# Patient Record
Sex: Male | Born: 1975 | Hispanic: No | Marital: Married | Smoking: Former smoker
Health system: Southern US, Community
[De-identification: ages and names within clinical notes are randomized; demographics above are authoritative.]

## PROBLEM LIST (undated history)

## (undated) DIAGNOSIS — S42309A Unspecified fracture of shaft of humerus, unspecified arm, initial encounter for closed fracture: Secondary | ICD-10-CM

## (undated) DIAGNOSIS — M549 Dorsalgia, unspecified: Secondary | ICD-10-CM

## (undated) DIAGNOSIS — R7301 Impaired fasting glucose: Secondary | ICD-10-CM

## (undated) DIAGNOSIS — E8881 Metabolic syndrome: Secondary | ICD-10-CM

## (undated) DIAGNOSIS — E669 Obesity, unspecified: Secondary | ICD-10-CM

## (undated) DIAGNOSIS — E785 Hyperlipidemia, unspecified: Secondary | ICD-10-CM

## (undated) DIAGNOSIS — G4733 Obstructive sleep apnea (adult) (pediatric): Secondary | ICD-10-CM

## (undated) DIAGNOSIS — F419 Anxiety disorder, unspecified: Secondary | ICD-10-CM

## (undated) HISTORY — DX: Impaired fasting glucose: R73.01

## (undated) HISTORY — DX: Obstructive sleep apnea (adult) (pediatric): G47.33

## (undated) HISTORY — DX: Metabolic syndrome: E88.81

## (undated) HISTORY — DX: Anxiety disorder, unspecified: F41.9

## (undated) HISTORY — DX: Unspecified fracture of shaft of humerus, unspecified arm, initial encounter for closed fracture: S42.309A

## (undated) HISTORY — DX: Hyperlipidemia, unspecified: E78.5

## (undated) HISTORY — DX: Obesity, unspecified: E66.9

## (undated) HISTORY — DX: Dorsalgia, unspecified: M54.9

---

## 2008-05-24 ENCOUNTER — Ambulatory Visit (HOSPITAL_BASED_OUTPATIENT_CLINIC_OR_DEPARTMENT_OTHER): Admission: RE | Admit: 2008-05-24 | Discharge: 2008-05-24 | Payer: Self-pay | Admitting: Internal Medicine

## 2008-05-27 ENCOUNTER — Ambulatory Visit: Payer: Self-pay | Admitting: Internal Medicine

## 2010-07-30 NOTE — Procedures (Signed)
Bobby Murphy, Bobby Murphy             ACCOUNT NO.:  [id number]   MEDICAL RECORD NO.:  [id number]          PATIENT TYPE:  OUT   LOCATION:  SLEEP CENTER                 FACILITY:  [facility]   PHYSICIAN:  Clinton D. Maple Hudson, MD, FCCP, FACPDATE OF BIRTH:  01-15-77   DATE OF STUDY:  05/24/2008                            NOCTURNAL POLYSOMNOGRAM   REFERRING PHYSICIAN:   REFERRING PHYSICIAN:  Kari Baars, MD   INDICATION FOR STUDY:  Hypersomnia with sleep apnea.   EPWORTH SLEEPINESS SCORE:  13/24, BMI 33.  Weight 230 pounds, height 70  inches.  Neck 17 inches.   MEDICATIONS:  Home medication charted and reviewed.   SLEEP ARCHITECTURE:  Split study protocol.  During the diagnostic phase,  total sleep time was 125 minutes with sleep efficiency 70.1%.  Stage I  was 4%, stage II 96%, stages III and REM were absent.  Sleep latency  26.5 minutes, awake during sleep 25 minutes, arousal index 31.1.  No  bedtime medication was taken.   RESPIRATORY DATA:  Split study protocol.  Apnea-hypopnea index (AHI)  23.4 per hour.  A total of 49 events was scored including 20 obstructive  apneas, 1 mixed apnea, and 28 hypopneas.  Events were predominantly  while supine.  CPAP was then titrated to 7 CWP, AHI 0.4 per hour.  He  wore a medium Respironics ComfortGel mask with heated humidifier.   OXYGEN DATA:  Moderately loud snoring before CPAP with oxygen  desaturation to a nadir of 88%.  After CPAP control, snoring was  prevented and mean oxygen saturation held 95.6% on room air.   CARDIAC DATA:  Sinus rhythm.   MOVEMENT-PARASOMNIA:  No significant movement disturbance.  Bathroom x1.   IMPRESSIONS-RECOMMENDATIONS:  1. Moderate obstructive sleep apnea/hypopnea syndrome, AHI 23.4 per      hour with most events recorded while supine.  Moderately loud      snoring with oxygen desaturation to a nadir of 88%.  2. Successful CPAP titration to 7 CWP, AHI 0.4 per hour.  He chose a      medium Respironics  ComfortGel mask with heated humidifier.      Clinton D. Maple Hudson, MD, [facility], FACP  Diplomate, Biomedical engineer of Sleep Medicine  Electronically Signed     CDY/MEDQ  D:  05/28/2008 11:00:21  T:  05/29/2008 03:13:18  Job:  [id number]

## 2014-04-19 ENCOUNTER — Other Ambulatory Visit (INDEPENDENT_AMBULATORY_CARE_PROVIDER_SITE_OTHER): Payer: Self-pay | Admitting: Surgery

## 2014-04-19 NOTE — H&P (Signed)
Bobby Murphy 04/19/2014 10:21 AM Location: [facility] Patient #: [id number] DOB: [birth year] Married / Language: [place] / Race: White Male History of Present Illness Ardeth Sportsman MD; 04/19/2014 10:46 AM) The patient is a 39 year old male who presents with an umbilical hernia. Pleasant patient sent by his primary care physician of concerns of symptomatic umbilical hernia. Pleasant obese very active male. Has felt some mild discomfort and lumpiness at his belly button for the past year. However became more tender and sensitive with increased activity this past winter. Especially with chopping wood and heavy lifting. He doesn't worsening pain and sensitive lump was concerned. I saw primary care physician. Concern for symptomatic umbilical hernia. Surgical consultation requested. Patient is rather active. Can hike a couple miles without difficulty. Has bowel movement every day. No prior abdominal surgery. No history of skin infections. No history of cardiac or pulmonary issues. He does not smoke. He is not on blood thinners. Other Problems Ethlyn Gallery, MA; 04/19/2014 10:22 AM) Anxiety Disorder Back Pain Transfusion history Umbilical Hernia Repair  Past Surgical History Elease Hashimoto Spillers, MA; 04/19/2014 10:22 AM) No pertinent past surgical history  Diagnostic Studies History Elease Hashimoto [place], MA; 04/19/2014 10:22 AM) Colonoscopy never  Allergies Elease Hashimoto Spillers, MA; 04/19/2014 10:23 AM) No Known Drug Allergies 04/19/2014  Medication History (Alisha Spillers, MA; 04/19/2014 10:23 AM) Cymbalta (  Capsule DR Part, Oral) Active. Fish Oil (  Capsule DR, Oral) Active. Medications Reconciled  Social History Elease Hashimoto [place], [place]; 04/19/2014 10:22 AM) Alcohol use Occasional alcohol use. Caffeine use Carbonated beverages, Coffee, Tea. Illicit drug use Remotely quit drug use. Tobacco use Former smoker.  Family History Elease Hashimoto [place], [place]; 04/19/2014  10:22 AM) Alcohol Abuse Brother.     Review of Systems (Alisha Spillers MA; 04/19/2014 10:22 AM) General Not Present- Appetite Loss, Chills, Fatigue, Fever, Night Sweats, Weight Gain and Weight Loss. Skin Not Present- Change in Wart/Mole, Dryness, Hives, Jaundice, New Lesions, Non-Healing Wounds, Rash and Ulcer. HEENT Present- Seasonal Allergies. Not Present- Earache, Hearing Loss, Hoarseness, Nose Bleed, Oral Ulcers, Ringing in the Ears, Sinus Pain, Sore Throat, Visual Disturbances, Wears glasses/contact lenses and Yellow Eyes. Respiratory Not Present- Bloody sputum, Chronic Cough, Difficulty Breathing, Snoring and Wheezing. Breast Not Present- Breast Mass, Breast Pain, Nipple Discharge and Skin Changes. Cardiovascular Not Present- Chest Pain, Difficulty Breathing Lying Down, Leg Cramps, Palpitations, Rapid Heart Rate, Shortness of Breath and Swelling of Extremities. Gastrointestinal Not Present- Abdominal Pain, Bloating, Bloody Stool, Change in Bowel Habits, Chronic diarrhea, Constipation, Difficulty Swallowing, Excessive gas, Gets full quickly at meals, Hemorrhoids, Indigestion, Nausea, Rectal Pain and Vomiting. Male Genitourinary Not Present- Blood in Urine, Change in Urinary Stream, Frequency, Impotence, Nocturia, Painful Urination, Urgency and Urine Leakage. Musculoskeletal Not Present- Back Pain, Joint Pain, Joint Stiffness, Muscle Pain, Muscle Weakness and Swelling of Extremities. Neurological Not Present- Decreased Memory, Fainting, Headaches, Numbness, Seizures, Tingling, Tremor, Trouble walking and Weakness. Psychiatric Present- Anxiety. Not Present- Bipolar, Change in Sleep Pattern, Depression, Fearful and Frequent crying. Endocrine Not Present- Cold Intolerance, Excessive Hunger, Hair Changes, Heat Intolerance, Hot flashes and New Diabetes. Hematology Not Present- Easy Bruising, Excessive bleeding, Gland problems, HIV and Persistent Infections.  Vitals (Alisha Spillers MA; 04/19/2014  10:22 AM) 04/19/2014 10:22 AM Weight: 262 lb Height: 70in Body Surface Area: 2.42 m Body Mass Index: 37.59 kg/m Pulse: 73 (Regular)  BP: 142/82 (Sitting, Left Arm, Standard)     Assessment & Plan Ardeth Sportsman MD; 04/19/2014 10:44 AM)  Dorethea Clan UMBILICAL HERNIA (552.1  K42.0) Impression: Very sensitive but  no evidence of strangulation. I think he would benefit repair. Given his young age and obesity, would benefit from mesh to minimize recurrence. I would do a diagnostic laparoscopy with underlay repair. He and his wife are interested in proceeding. Hopefully outpatient procedure:  The anatomy & physiology of the abdominal wall was discussed. The pathophysiology of hernias was discussed. Natural history risks without surgery including progeressive enlargement, pain, incarceration & strangulation was discussed. Contributors to complications such as smoking, obesity, diabetes, prior surgery, etc were discussed.  I feel the risks of no intervention will lead to serious problems that outweigh the operative risks; therefore, I recommended surgery to reduce and repair the hernia. I explained laparoscopic techniques with possible need for an open approach. I noted the probable use of mesh to patch and/or buttress the hernia repair  Risks such as bleeding, infection, abscess, need for further treatment, heart attack, death, and other risks were discussed. I noted a good likelihood this will help address the problem. Goals of post-operative recovery were discussed as well. Possibility that this will not correct all symptoms was explained. I stressed the importance of low-impact activity, aggressive pain control, avoiding constipation, & not pushing through pain to minimize risk of post-operative chronic pain or injury. Possibility of reherniation especially with smoking, obesity, diabetes, immunosuppression, and other health conditions was discussed. We will work to minimize  complications.  An educational handout further explaining the pathology & treatment options was given as well. Questions were answered. The patient expresses understanding & wishes to proceed with surgery.  Current Plans Schedule for Surgery Pt Education - [facility] Hernia Post-Op HCI (Peggy Monk): discussed with patient and provided information. Pt Education - [facility] Pain Control (Erleen Egner) Pt Education - [facility] Good Bowel Health (Rhyleigh Grassel) The anatomy & physiology of the abdominal wall was discussed. The pathophysiology of hernias was discussed. Natural history risks without surgery including progeressive enlargement, pain, incarceration, & strangulation was discussed. Contributors to complications such as smoking, obesity, diabetes, prior surgery, etc were discussed.  I feel the risks of no intervention will lead to serious problems that outweigh the operative risks; therefore, I recommended surgery to reduce and repair the hernia. I explained laparoscopic techniques with possible need for an open approach. I noted the probable use of mesh to patch and/or buttress the hernia repair  Risks such as bleeding, infection, abscess, need for further treatment, heart attack, death, and other risks were discussed. I noted a good likelihood this will help address the problem. Goals of post-operative recovery were discussed as well. Possibility that this will not correct all symptoms was explained. I stressed the importance of low-impact activity, aggressive pain control, avoiding constipation, & not pushing through pain to minimize risk of post-operative chronic pain or injury. Possibility of reherniation especially with smoking, obesity, diabetes, immunosuppression, and other health conditions was discussed. We will work to minimize complications.  An educational handout further explaining the pathology & treatment options was given as well. Questions were answered. The patient expresses understanding & wishes to proceed with  surgery.  Ardeth SportsmanSteven C. Meiko Stranahan, M.D., F.A.C.S. Gastrointestinal and Minimally Invasive Surgery [facility] [address] [place], [place] [address] [phone] Main / Paging

## 2015-07-31 DIAGNOSIS — L732 Hidradenitis suppurativa: Secondary | ICD-10-CM | POA: Diagnosis not present

## 2015-08-06 DIAGNOSIS — Z0000 Encounter for general adult medical examination without abnormal findings: Secondary | ICD-10-CM | POA: Diagnosis not present

## 2015-08-06 DIAGNOSIS — R7301 Impaired fasting glucose: Secondary | ICD-10-CM | POA: Diagnosis not present

## 2015-08-06 DIAGNOSIS — Z125 Encounter for screening for malignant neoplasm of prostate: Secondary | ICD-10-CM | POA: Diagnosis not present

## 2015-08-15 DIAGNOSIS — E668 Other obesity: Secondary | ICD-10-CM | POA: Diagnosis not present

## 2015-08-15 DIAGNOSIS — R7301 Impaired fasting glucose: Secondary | ICD-10-CM | POA: Diagnosis not present

## 2015-08-15 DIAGNOSIS — E8881 Metabolic syndrome: Secondary | ICD-10-CM | POA: Diagnosis not present

## 2015-08-15 DIAGNOSIS — Z0000 Encounter for general adult medical examination without abnormal findings: Secondary | ICD-10-CM | POA: Diagnosis not present

## 2015-08-15 DIAGNOSIS — E784 Other hyperlipidemia: Secondary | ICD-10-CM | POA: Diagnosis not present

## 2015-08-15 DIAGNOSIS — Z1389 Encounter for screening for other disorder: Secondary | ICD-10-CM | POA: Diagnosis not present

## 2016-04-25 DIAGNOSIS — G4733 Obstructive sleep apnea (adult) (pediatric): Secondary | ICD-10-CM | POA: Diagnosis not present

## 2016-09-04 DIAGNOSIS — R7301 Impaired fasting glucose: Secondary | ICD-10-CM | POA: Diagnosis not present

## 2016-09-04 DIAGNOSIS — Z0000 Encounter for general adult medical examination without abnormal findings: Secondary | ICD-10-CM | POA: Diagnosis not present

## 2016-09-04 DIAGNOSIS — Z125 Encounter for screening for malignant neoplasm of prostate: Secondary | ICD-10-CM | POA: Diagnosis not present

## 2016-09-04 DIAGNOSIS — E784 Other hyperlipidemia: Secondary | ICD-10-CM | POA: Diagnosis not present

## 2016-09-09 DIAGNOSIS — D225 Melanocytic nevi of trunk: Secondary | ICD-10-CM | POA: Diagnosis not present

## 2016-09-09 DIAGNOSIS — L570 Actinic keratosis: Secondary | ICD-10-CM | POA: Diagnosis not present

## 2016-09-11 DIAGNOSIS — Z0000 Encounter for general adult medical examination without abnormal findings: Secondary | ICD-10-CM | POA: Diagnosis not present

## 2016-09-11 DIAGNOSIS — Z1389 Encounter for screening for other disorder: Secondary | ICD-10-CM | POA: Diagnosis not present

## 2016-09-11 DIAGNOSIS — E784 Other hyperlipidemia: Secondary | ICD-10-CM | POA: Diagnosis not present

## 2016-09-11 DIAGNOSIS — E669 Obesity, unspecified: Secondary | ICD-10-CM | POA: Diagnosis not present

## 2016-09-11 DIAGNOSIS — R7301 Impaired fasting glucose: Secondary | ICD-10-CM | POA: Diagnosis not present

## 2016-09-11 DIAGNOSIS — E8881 Metabolic syndrome: Secondary | ICD-10-CM | POA: Diagnosis not present

## 2016-10-31 DIAGNOSIS — G4733 Obstructive sleep apnea (adult) (pediatric): Secondary | ICD-10-CM | POA: Diagnosis not present

## 2017-02-03 DIAGNOSIS — G4733 Obstructive sleep apnea (adult) (pediatric): Secondary | ICD-10-CM | POA: Diagnosis not present

## 2017-03-13 DIAGNOSIS — J101 Influenza due to other identified influenza virus with other respiratory manifestations: Secondary | ICD-10-CM | POA: Diagnosis not present

## 2017-05-12 DIAGNOSIS — G4733 Obstructive sleep apnea (adult) (pediatric): Secondary | ICD-10-CM | POA: Diagnosis not present

## 2017-08-11 DIAGNOSIS — G4733 Obstructive sleep apnea (adult) (pediatric): Secondary | ICD-10-CM | POA: Diagnosis not present

## 2017-09-09 DIAGNOSIS — R7301 Impaired fasting glucose: Secondary | ICD-10-CM | POA: Diagnosis not present

## 2017-09-09 DIAGNOSIS — E7849 Other hyperlipidemia: Secondary | ICD-10-CM | POA: Diagnosis not present

## 2017-09-09 DIAGNOSIS — Z125 Encounter for screening for malignant neoplasm of prostate: Secondary | ICD-10-CM | POA: Diagnosis not present

## 2017-09-16 DIAGNOSIS — E668 Other obesity: Secondary | ICD-10-CM | POA: Diagnosis not present

## 2017-09-16 DIAGNOSIS — E7849 Other hyperlipidemia: Secondary | ICD-10-CM | POA: Diagnosis not present

## 2017-09-16 DIAGNOSIS — Z0000 Encounter for general adult medical examination without abnormal findings: Secondary | ICD-10-CM | POA: Diagnosis not present

## 2017-09-16 DIAGNOSIS — R7301 Impaired fasting glucose: Secondary | ICD-10-CM | POA: Diagnosis not present

## 2017-09-16 DIAGNOSIS — E8881 Metabolic syndrome: Secondary | ICD-10-CM | POA: Diagnosis not present

## 2017-09-16 DIAGNOSIS — Z1389 Encounter for screening for other disorder: Secondary | ICD-10-CM | POA: Diagnosis not present

## 2017-11-19 DIAGNOSIS — G4733 Obstructive sleep apnea (adult) (pediatric): Secondary | ICD-10-CM | POA: Diagnosis not present

## 2017-11-24 DIAGNOSIS — L814 Other melanin hyperpigmentation: Secondary | ICD-10-CM | POA: Diagnosis not present

## 2017-11-24 DIAGNOSIS — L570 Actinic keratosis: Secondary | ICD-10-CM | POA: Diagnosis not present

## 2017-11-24 DIAGNOSIS — L821 Other seborrheic keratosis: Secondary | ICD-10-CM | POA: Diagnosis not present

## 2018-09-13 DIAGNOSIS — E7849 Other hyperlipidemia: Secondary | ICD-10-CM | POA: Diagnosis not present

## 2018-09-13 DIAGNOSIS — Z0000 Encounter for general adult medical examination without abnormal findings: Secondary | ICD-10-CM | POA: Diagnosis not present

## 2018-09-13 DIAGNOSIS — Z125 Encounter for screening for malignant neoplasm of prostate: Secondary | ICD-10-CM | POA: Diagnosis not present

## 2018-09-13 DIAGNOSIS — R7301 Impaired fasting glucose: Secondary | ICD-10-CM | POA: Diagnosis not present

## 2018-09-21 DIAGNOSIS — E669 Obesity, unspecified: Secondary | ICD-10-CM | POA: Diagnosis not present

## 2018-09-21 DIAGNOSIS — R7301 Impaired fasting glucose: Secondary | ICD-10-CM | POA: Diagnosis not present

## 2018-09-21 DIAGNOSIS — E8881 Metabolic syndrome: Secondary | ICD-10-CM | POA: Diagnosis not present

## 2018-09-21 DIAGNOSIS — Z1331 Encounter for screening for depression: Secondary | ICD-10-CM | POA: Diagnosis not present

## 2018-09-21 DIAGNOSIS — E785 Hyperlipidemia, unspecified: Secondary | ICD-10-CM | POA: Diagnosis not present

## 2018-09-21 DIAGNOSIS — Z0000 Encounter for general adult medical examination without abnormal findings: Secondary | ICD-10-CM | POA: Diagnosis not present

## 2018-10-13 ENCOUNTER — Ambulatory Visit: Payer: Self-pay | Admitting: Surgery

## 2018-10-13 DIAGNOSIS — K432 Incisional hernia without obstruction or gangrene: Secondary | ICD-10-CM | POA: Diagnosis not present

## 2018-10-13 DIAGNOSIS — M6208 Separation of muscle (nontraumatic), other site: Secondary | ICD-10-CM | POA: Diagnosis not present

## 2018-10-13 NOTE — H&P (Signed)
Maryagnes Amos Documented: 10/13/2018 9:50 AM Location: [facility] Patient #: [id number] DOB: 13-Jul-1975 Married / Language: Cleophus Molt / Race: White Male  History of Present Illness Adin Hector MD; 10/13/2018 10:38 AM) The patient is a 43 year old male who presents for a hernia surgery post-op. Note for "Hernia surgery post-op": ` ` ` The patient returns s/p laparoscopic repair of periumbilical ventral hernias with mesh in 2016.      Pleasant obese but very active man. I did laparoscopic mesh repair of periumbilical ventral hernias 2 in 2016. He recovered well from that. He's been doing more work and his parents house with intense chores. Felt some pulling and discomfort. Felt some bulging as well. He was concerned that maybe a hernia came back. Discuss with his primary care physician. Surgical consultation requested. Patient denies any fevers chills or sweats. No nausea or vomiting. He does remain obese, he is very physically active and is intentional some weight. He remains abstinent from smoking. No diabetes issues. He moves his bowels every day.   Problem List/Past Medical Adin Hector, MD; 10/13/2018 10:16 AM) Nira Conn UMBILICAL HERNIA (R67.8) S/P HERNIA REPAIR (L38.101)  Past Surgical History Nance Pew, [place]; 10/13/2018 9:52 AM) Oral Surgery  Diagnostic Studies History Nance Pew, CMA; 10/13/2018 9:52 AM) Colonoscopy never  Allergies ([place], CMA; 10/13/2018 9:53 AM) No Known Drug Allergies [04/19/2014]: Allergies Reconciled  Medication History Nance Pew, CMA; 10/13/2018 9:53 AM) Lexapro (10MG  Tablet, Oral) Active. Fish Oil (1000MG  Capsule DR, Oral) Active. Medications Reconciled  Social History Nance Pew, CMA; 10/13/2018 9:52 AM) Alcohol use Occasional alcohol use. Caffeine use Carbonated beverages, Coffee, Tea. Illicit drug use Remotely quit drug use. Tobacco use Former smoker.  Family History  Nance Pew, [place]; 10/13/2018 9:52 AM) Alcohol Abuse Brother. Anesthetic complications Family Members In General. Arthritis Family Members In General. Bleeding disorder Family Members In General. Breast Cancer Family Members In General. Cancer Family Members In General. Cerebrovascular Accident Family Members In General. Cervical Cancer Family Members In [place] Members In General. Colon Polyps Family Members In General. Depression Family Members In General. Diabetes Mellitus Mother. Heart Disease Family Members In General. Hypertension Family Members In General. Ischemic Bowel Disease Family Members In General. Kidney Disease Family Members In General. Malignant Neoplasm Of Pancreas Family Members In General. Melanoma Family Members In General. Migraine Headache Family Members In General. Ovarian Cancer Family Members In General. Prostate Cancer Family Members In General. Rectal Cancer Family Members In General. Respiratory Condition Family Members In General. Seizure disorder Family Members In General. Thyroid problems Family Members In General.  Other Problems Adin Hector, MD; 10/13/2018 10:16 AM) Anxiety Disorder     Review of Systems ([place]; 10/13/2018 9:52 AM) General Not Present- Appetite Loss, Chills, Fatigue, Fever, Night Sweats, Weight Gain and Weight Loss. Skin Not Present- Change in Wart/Mole, Dryness, Hives, Jaundice, New Lesions, Non-Healing Wounds, Rash and Ulcer. HEENT Not Present- Earache, Hearing Loss, Hoarseness, Nose Bleed, Oral Ulcers, Ringing in the Ears, Seasonal Allergies, Sinus Pain, Sore Throat, Visual Disturbances, Wears glasses/contact lenses and Yellow Eyes. Respiratory Not Present- Bloody sputum, Chronic Cough, Difficulty Breathing, Snoring and Wheezing. Breast Not Present- Breast Mass, Breast Pain, Nipple Discharge and Skin Changes. Cardiovascular Not Present- Chest Pain, Difficulty  Breathing Lying Down, Leg Cramps, Palpitations, Rapid Heart Rate, Shortness of Breath and Swelling of Extremities. Gastrointestinal Not Present- Abdominal Pain, Bloating, Bloody Stool, Change in Bowel Habits, Chronic diarrhea, Constipation, Difficulty Swallowing, Excessive gas, Gets full quickly at  meals, Hemorrhoids, Indigestion, Nausea, Rectal Pain and Vomiting. Male Genitourinary Not Present- Blood in Urine, Change in Urinary Stream, Frequency, Impotence, Nocturia, Painful Urination, Urgency and Urine Leakage. Musculoskeletal Not Present- Back Pain, Joint Pain, Joint Stiffness, Muscle Pain, Muscle Weakness and Swelling of Extremities. Neurological Not Present- Decreased Memory, Fainting, Headaches, Numbness, Seizures, Tingling, Tremor, Trouble walking and Weakness. Psychiatric Present- Anxiety. Not Present- Bipolar, Change in Sleep Pattern, Depression, Fearful and Frequent crying. Endocrine Not Present- Cold Intolerance, Excessive Hunger, Hair Changes, Heat Intolerance, Hot flashes and New Diabetes. Hematology Not Present- Blood Thinners, Easy Bruising, Excessive bleeding, Gland problems, HIV and Persistent Infections.  Vitals (Sabrina Canty CMA; 10/13/2018 9:54 AM) 10/13/2018 9:53 AM Weight: 244.13 lb Height: 70in Body Surface Area: 2.27 m Body Mass Index: 35.03 kg/m  Temp.: 97.89F(Oral)  Pulse: 95 (Regular)  P.OX: 97% (Room air) BP: 158/90 (Sitting, Left Arm, Standard)        Physical Exam Ardeth Sportsman(Bilbo Carcamo C. Ceola Para MD; 10/13/2018 10:38 AM)  General Mental Status-Alert. General Appearance-Not in acute distress. Voice-Normal.  Integumentary Global Assessment Normal Exam - Distribution of scalp and body hair is normal. General Characteristics Overall Skin Surface - no rashes and no suspicious lesions.  Head and Neck Head-normocephalic, atraumatic with no lesions or palpable masses. Face Global Assessment - atraumatic, no absence of expression. Neck Global  Assessment - no abnormal movements, no decreased range of motion. Trachea-midline. Thyroid Gland Characteristics - non-tender.  Eye Eyeball - Left-Extraocular movements intact, No Nystagmus - Left. Eyeball - Right-Extraocular movements intact, No Nystagmus - Right. Upper Eyelid - Left-No Cyanotic - Left. Upper Eyelid - Right-No Cyanotic - Right.  Chest and Lung Exam Inspection Accessory muscles - No use of accessory muscles in breathing.  Abdomen Note: Obese but soft. Some subcutaneous bulging in the epigastric midline region within diastases recti. I feel an impulse with him standing and coughing suspicious for. No periumbilical masses or hernias there is. No guarding. No cellulitis or abscess.  Male Genitourinary Note: No inguinal hernia  Peripheral Vascular Upper Extremity Inspection - Not Gangrenous, No Petechiae. Inspection - Not Gangrenous, No Petechiae.  Neurologic Neurologic evaluation reveals -normal attention span and ability to concentrate, able to name objects and repeat phrases. Appropriate fund of knowledge and normal coordination.  Neuropsychiatric Mental status exam performed with findings of-able to articulate well with normal speech/language, rate, volume and coherence and no evidence of hallucinations, delusions, obsessions or homicidal/suicidal ideation. Orientation-oriented X3.  Musculoskeletal Global Assessment Gait and Station - normal gait and station.  Lymphatic General Lymphatics Description - No Generalized lymphadenopathy.    Assessment & Plan Ardeth Sportsman(Calyx Hawker C. 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 I suspect it's at the upper corner of his prior laparoscopic mesh since his prior hernias were periumbilical and this is higher up. Most likely related to his intense physical activity,  obesity, and diastases recti.  I think he would benefit from surgery to repair. Do at least diagnostic laparoscopy. I don't think a CAT scan would add much to this since I think this is going to be more subtle. He is interested in getting it repaired. Unfortunately his mother in law suddenly passed away, so they are trying to sort that out. This is not an emergency so we have time to sort it out and get down hopefully before the end of the year.   PREOP - [facility] - ENCOUNTER FOR PREOPERATIVE EXAMINATION  FOR GENERAL SURGICAL PROCEDURE (Z01.818)  Current Plans You are being scheduled for surgery- Our schedulers will call you.  You should hear from our office's scheduling department within 5 working days about the location, date, and time of surgery. We try to make accommodations for patient's preferences in scheduling surgery, but sometimes the OR schedule or the surgeon's schedule prevents us from making those accommodations.  If you have not heard from our office [phone]) in 5 working days, call the office and ask for your surgeon's nurse.  If you have other questions about your diagnosis, plan, or surgery, call the office and ask for your surgeon's nurse.  Written instructions provided The anatomy & physiology of the abdominal wall was discussed. The pathophysiology of hernias was discussed. Natural history risks without surgery including progeressive enlargement, pain, incarceration, & strangulation was discussed. Contributors to complications such as smoking, obesity, diabetes, prior surgery, etc were discussed.  I feel the risks of no intervention will lead to serious problems that outweigh the operative risks; therefore, I recommended surgery to reduce and repair the hernia. I explained laparoscopic techniques with possible need for an open approach. I noted the probable use of mesh to patch and/or buttress the hernia repair  Risks such as bleeding, infection, abscess, need for  further treatment, heart attack, death, and other risks were discussed. I noted a good likelihood this will help address the problem. Goals of post-operative recovery were discussed as well. Possibility that this will not correct all symptoms was explained. I stressed the importance of low-impact activity, aggressive pain control, avoiding constipation, & not pushing through pain to minimize risk of post-operative chronic pain or injury. Possibility of reherniation especially with smoking, obesity, diabetes, immunosuppression, and other health conditions was discussed. We will work to minimize complications.  An educational handout further explaining the pathology & treatment options was given as well. Questions were answered. The patient expresses understanding & wishes to proceed with surgery.  Pt Education - [facility] Hernia Post-Op HCI (Aristide Waggle): discussed with patient and provided information. Pt Education - [facility] Pain Control (Christyne Mccain) Pt Education - Pamphlet Given - Laparoscopic Hernia Repair: discussed with patient and provided information. Pt Education - [facility] Mesh education: discussed with patient and provided information.  DIASTASIS RECTI (M62.08)  Current Plans Pt Education - [facility] Diastasis Recti: discussed with patient and provided information.  Ardeth SportsmanSteven C. Geremiah Fussell, MD, FACS, MASCRS Gastrointestinal and Minimally Invasive Surgery    [address] [place], [place] [id number] [phone] Main / Paging [phone] Fax

## 2019-01-16 DIAGNOSIS — R55 Syncope and collapse: Secondary | ICD-10-CM

## 2019-01-16 HISTORY — DX: Syncope and collapse: R55

## 2019-02-01 DIAGNOSIS — R030 Elevated blood-pressure reading, without diagnosis of hypertension: Secondary | ICD-10-CM | POA: Diagnosis not present

## 2019-02-01 DIAGNOSIS — R55 Syncope and collapse: Secondary | ICD-10-CM | POA: Diagnosis not present

## 2019-02-01 DIAGNOSIS — R7301 Impaired fasting glucose: Secondary | ICD-10-CM | POA: Diagnosis not present

## 2019-02-01 DIAGNOSIS — G4733 Obstructive sleep apnea (adult) (pediatric): Secondary | ICD-10-CM | POA: Diagnosis not present

## 2019-03-29 DIAGNOSIS — D225 Melanocytic nevi of trunk: Secondary | ICD-10-CM | POA: Diagnosis not present

## 2019-03-29 DIAGNOSIS — L2089 Other atopic dermatitis: Secondary | ICD-10-CM | POA: Diagnosis not present

## 2019-03-29 DIAGNOSIS — L578 Other skin changes due to chronic exposure to nonionizing radiation: Secondary | ICD-10-CM | POA: Diagnosis not present

## 2019-03-29 DIAGNOSIS — L570 Actinic keratosis: Secondary | ICD-10-CM | POA: Diagnosis not present

## 2019-06-14 DIAGNOSIS — G4733 Obstructive sleep apnea (adult) (pediatric): Secondary | ICD-10-CM | POA: Diagnosis not present

## 2019-09-12 DIAGNOSIS — G4733 Obstructive sleep apnea (adult) (pediatric): Secondary | ICD-10-CM | POA: Diagnosis not present

## 2019-10-11 DIAGNOSIS — E7849 Other hyperlipidemia: Secondary | ICD-10-CM | POA: Diagnosis not present

## 2019-10-11 DIAGNOSIS — R7301 Impaired fasting glucose: Secondary | ICD-10-CM | POA: Diagnosis not present

## 2019-10-11 DIAGNOSIS — Z0000 Encounter for general adult medical examination without abnormal findings: Secondary | ICD-10-CM | POA: Diagnosis not present

## 2019-10-11 DIAGNOSIS — Z125 Encounter for screening for malignant neoplasm of prostate: Secondary | ICD-10-CM | POA: Diagnosis not present

## 2019-12-12 DIAGNOSIS — G4733 Obstructive sleep apnea (adult) (pediatric): Secondary | ICD-10-CM | POA: Diagnosis not present

## 2020-02-16 DIAGNOSIS — Z20822 Contact with and (suspected) exposure to covid-19: Secondary | ICD-10-CM | POA: Diagnosis not present

## 2020-02-16 DIAGNOSIS — R82998 Other abnormal findings in urine: Secondary | ICD-10-CM | POA: Diagnosis not present

## 2020-03-12 DIAGNOSIS — G4733 Obstructive sleep apnea (adult) (pediatric): Secondary | ICD-10-CM | POA: Diagnosis not present

## 2020-03-15 DIAGNOSIS — L814 Other melanin hyperpigmentation: Secondary | ICD-10-CM | POA: Diagnosis not present

## 2020-03-15 DIAGNOSIS — D225 Melanocytic nevi of trunk: Secondary | ICD-10-CM | POA: Diagnosis not present

## 2020-03-15 DIAGNOSIS — L570 Actinic keratosis: Secondary | ICD-10-CM | POA: Diagnosis not present

## 2020-03-15 DIAGNOSIS — L578 Other skin changes due to chronic exposure to nonionizing radiation: Secondary | ICD-10-CM | POA: Diagnosis not present

## 2020-07-05 ENCOUNTER — Other Ambulatory Visit: Payer: Self-pay | Admitting: Neurology

## 2020-07-05 ENCOUNTER — Encounter: Payer: Self-pay | Admitting: Neurology

## 2020-07-10 ENCOUNTER — Institutional Professional Consult (permissible substitution): Payer: BC Managed Care – PPO | Admitting: Neurology

## 2020-07-23 DIAGNOSIS — G4733 Obstructive sleep apnea (adult) (pediatric): Secondary | ICD-10-CM | POA: Diagnosis not present

## 2020-08-21 ENCOUNTER — Encounter: Payer: Self-pay | Admitting: *Deleted

## 2020-08-21 ENCOUNTER — Encounter: Payer: Self-pay | Admitting: Neurology

## 2020-08-22 ENCOUNTER — Ambulatory Visit (INDEPENDENT_AMBULATORY_CARE_PROVIDER_SITE_OTHER): Payer: BC Managed Care – PPO | Admitting: Neurology

## 2020-08-22 ENCOUNTER — Encounter: Payer: Self-pay | Admitting: Neurology

## 2020-08-22 VITALS — BP 122/82 | HR 62 | Ht 70.0 in | Wt 252.0 lb

## 2020-08-22 DIAGNOSIS — Z9989 Dependence on other enabling machines and devices: Secondary | ICD-10-CM | POA: Diagnosis not present

## 2020-08-22 DIAGNOSIS — G4733 Obstructive sleep apnea (adult) (pediatric): Secondary | ICD-10-CM | POA: Diagnosis not present

## 2020-08-22 DIAGNOSIS — G478 Other sleep disorders: Secondary | ICD-10-CM

## 2020-08-22 NOTE — Patient Instructions (Signed)
[email or web address]">  Obesity-Hypoventilation Syndrome Obesity-hypoventilation syndrome (OHS) is a condition in which a person cannot efficiently move air in and out of the lungs (ventilate). This condition causes hypoventilation, which means the blood will have a buildup of carbon dioxideand a drop in oxygen levels. Key factors for OHS include having too much body fat, or obesity, and having high levels of awake daytime carbon dioxide (hypercapnia). OHS can increase the risk for:  Cor pulmonale, or right-sided heart failure.  Congestive heart failure.  Pulmonary hypertension, or high blood pressure in the arteries in the lungs.  Too many red blood cells in the body.  Disability or death. What are the causes? This condition may be caused by:  Being obese with a BMI (body mass index) greater than or equal to 30 kg/m2.  Having too much fat around the abdomen, chest, and neck.  The brain being unable to properly manage the high carbon dioxide and low oxygen levels.  Hormones made by fat cells. These hormones may interfere with breathing.  Sleep apnea. This is when breathing stops, pauses, or is shallow during sleep. What are the signs or symptoms? Symptoms of this condition include:  Feeling sleepy during the day.  Headaches. These may be worse in the morning.  Shortness of breath.  Snoring, choking, gasping, or trouble breathing during sleep.  Poor concentration or poor memory.  Mood changes or feeling irritable.  Depression. How is this diagnosed? This condition may be diagnosed by:  BMI measurement.  Blood tests to measure blood levels of serum bicarbonate, carbon dioxide, and oxygen.  Pulse oximetry to measure the amount of oxygen in your blood. This uses a small device that is placed on your finger, earlobe, or toe.  Polysomnogram, or sleep study, to check your breathing patterns and  levels of oxygen and carbon dioxide while you sleep. You may also have other tests, including:  A chest X-ray to rule out other breathing problems.  Lung tests, or pulmonary function tests, to rule out other breathing problems.  ECG (electrocardiogram) or echocardiogram to check for signs of heart failure. How is this treated? This condition may be treated with:  A device such as a continuous positive airway pressure (CPAP) machine or a bi-level positive airway pressure (BPAP) machine. These devices deliver pressure and sometimes oxygen to make breathing easier. A mask may be placed over your nose or mouth.  Oxygen if your blood oxygen levels are low.  A weight-loss program.  Bariatric, or weight-loss, surgery.  Tracheostomy. A tube is placed in the windpipe through the neck to help with breathing.   Follow these instructions at home: Medicines  Take over-the-counter and prescription medicines only as told by your health care provider.  Ask your health care provider what medicines are safe for you. You may be told to avoid medicines such as sedatives and narcotics. These can affect breathing and make OHS worse. Sleeping habits  If you are prescribed a CPAP or a BPAP machine, make sure you understand how to use it. Use your CPAP or BPAP machine only as told by your health care provider.  Try to get at least 8 hours of sleep every night. Eating and drinking  Eat foods that are high in fiber, such as beans, whole grains, and fresh fruits and vegetables.  Limit foods that are high in fat and processed sugars, such as fried or sweet foods.  Drink enough fluid to keep your urine pale yellow.  Do not drink alcohol if: ? Your   health care provider tells you not to drink. ? You are pregnant, may be pregnant, or are planning to become pregnant.   General instructions  Follow a diet and exercise plan that helps you reach and keep a healthy weight as told by your health care  provider.  Exercise regularly as told by your health care provider.  Do not use any products that contain nicotine or tobacco, such as cigarettes, e-cigarettes, and chewing tobacco. If you need help quitting, ask your health care provider.  Keep all follow-up visits as told by your health care provider. This is important.   Contact a health care provider if:  You develop new or worsening shortness of breath.  You are having trouble waking up or staying awake.  You are confused.  You have chest pain.  You have fast or irregular heartbeats.  You are dizzy or you faint.  You develop a cough.  You have a fever. Get help right away if: You have any symptoms of a stroke. "BE FAST" is an easy way to remember the main warning signs of a stroke:  B - Balance. Signs are dizziness, sudden trouble walking, or loss of balance.  E - Eyes. Signs are trouble seeing or a sudden change in vision.  F - Face. Signs are sudden weakness or numbness of the face, or the face or eyelid drooping on one side.  A - Arms. Signs are weakness or numbness in an arm. This happens suddenly and usually on one side of the body.  S - Speech. Signs are sudden trouble speaking, slurred speech, or trouble understanding what people say.  T - Time. Time to call emergency services. Write down what time symptoms started. You have other signs of a stroke, such as:  A sudden, severe headache with no known cause.  Nausea or vomiting.  Seizure. These symptoms may represent a serious problem that is an emergency. Do not wait to see if the symptoms will go away. Get medical help right away. Call your local emergency services (911 in the [place] Do not drive yourself to the hospital. If you ever feel like you may hurt yourself or others, or have thoughts about taking your own life, get help right away. Go to your nearest emergency department or:  Call your local emergency services (911 in the [place]  Call a suicide  crisis helpline, such as the National Suicide Prevention Lifeline at 1-800-273-8255. This is open 24 hours a day in the [place]  Text the Crisis Text Line at 741741 (in the [place] Summary  Obesity-hypoventilation syndrome (OHS) causes hypoventilation, which means the blood will have a buildup of carbon dioxideand a drop in oxygen levels.  Key factors for OHS include having too much body fat, or obesity, and having high levels of awake daytime carbon dioxide (hypercapnia).  OHS can increase the risk for heart failure, pulmonary hypertension, disability, and death.  Follow your diet and exercise plan as told by your health care provider. This information is not intended to replace advice given to you by your health care provider. Make sure you discuss any questions you have with your health care provider. Document Revised: 02/25/2019 Document Reviewed: 02/25/2019 Elsevier Patient Education  2021 Elsevier Inc.  
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
## 2020-10-22 DIAGNOSIS — G4733 Obstructive sleep apnea (adult) (pediatric): Secondary | ICD-10-CM | POA: Diagnosis not present

## 2020-10-25 ENCOUNTER — Telehealth: Payer: Self-pay | Admitting: Neurology

## 2020-10-25 ENCOUNTER — Other Ambulatory Visit: Payer: Self-pay | Admitting: Neurology

## 2020-10-25 DIAGNOSIS — G4733 Obstructive sleep apnea (adult) (pediatric): Secondary | ICD-10-CM

## 2020-10-25 NOTE — Telephone Encounter (Signed)
Called the patient because he is scheduled on Monday for a follow up visit. The patient has not completed the sleep study yet and looks like based on ov notes in June the patient was to complete sleep study and get set up with a new cpap machine.  I will place order for hst as inlab was already denied. The sleep lab will contact the patient.   There was no answer when I called to cancel. LVM advising the pt this information and asking for a call back to confirm cancelling the apt  **If pt returns call please just let him know our sleep lab will call to get set up with HST. Please advise the Monday visit is not necessary

## 2020-10-29 ENCOUNTER — Other Ambulatory Visit: Payer: Self-pay

## 2020-10-29 ENCOUNTER — Ambulatory Visit (INDEPENDENT_AMBULATORY_CARE_PROVIDER_SITE_OTHER): Payer: BC Managed Care – PPO | Admitting: Neurology

## 2020-10-29 NOTE — Telephone Encounter (Signed)
FYI for Palmer, RN pt called back and the message was read to him.  No call back requested.

## 2020-11-01 ENCOUNTER — Ambulatory Visit (INDEPENDENT_AMBULATORY_CARE_PROVIDER_SITE_OTHER): Payer: BC Managed Care – PPO | Admitting: Neurology

## 2020-11-01 DIAGNOSIS — G4733 Obstructive sleep apnea (adult) (pediatric): Secondary | ICD-10-CM | POA: Diagnosis not present

## 2020-11-01 DIAGNOSIS — Z9989 Dependence on other enabling machines and devices: Secondary | ICD-10-CM

## 2020-11-28 ENCOUNTER — Telehealth: Payer: Self-pay | Admitting: Neurology

## 2020-11-28 DIAGNOSIS — G4733 Obstructive sleep apnea (adult) (pediatric): Secondary | ICD-10-CM | POA: Insufficient documentation

## 2020-11-28 DIAGNOSIS — Z9989 Dependence on other enabling machines and devices: Secondary | ICD-10-CM | POA: Insufficient documentation
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
## 2020-11-28 NOTE — Addendum Note (Signed)
Addended by: Melvyn Novas on: 11/28/2020 06:27 PM   Modules accepted: Orders

## 2020-11-28 NOTE — Telephone Encounter (Signed)
Mail in device was read today, Data were just made available to me, I am sorry for the delay, I ordered the new CPSAP as URGENT.
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
## 2020-11-28 NOTE — Progress Notes (Signed)
IMPRESSION:  This HST confirms the presence of severe obstructive sleep apnea and the patient is considered CPAP dependent.  RECOMMENDATION:This study was ordered as an urgent home sleep test and yet the data were only made available to me on 11-28-2020.    The patient is used to a CPAP therapy of 7 cmH2O, which is surprisingly low pressure for the degree of apnea displayed on this home sleep test.  I will order a new CPAP machine for him.  I will set the machine at 8 cm water pressure without EPR, heated humidification will be provided in the mask of the patient's choice.  The patient is currently using a full facemask.  Follow-up within the next 90 days after therapy begin.  I have ordered an urgent delivery for his machine.  INTERPRETING PHYSICIAN: Melvyn Novas, MD   Medical Director of [facility] at [facility].

## 2020-11-28 NOTE — Telephone Encounter (Signed)
Pt states he has not heard from anyone since the sleep study over a month ago, please call

## 2020-11-29 ENCOUNTER — Telehealth: Payer: Self-pay | Admitting: Neurology

## 2020-11-29 ENCOUNTER — Encounter: Payer: Self-pay | Admitting: Neurology

## 2020-11-29 NOTE — Telephone Encounter (Signed)
-----   Message from Melvyn Novas, MD sent at 11/28/2020  6:27 PM EDT ----- IMPRESSION:  This HST confirms the presence of severe obstructive sleep apnea and the patient is considered CPAP dependent.  RECOMMENDATION:This study was ordered as an urgent home sleep test and yet the data were only made available to me on 11-28-2020.    The patient is used to a CPAP therapy of 7 cmH2O, which is surprisingly low pressure for the degree of apnea displayed on this home sleep test.  I will order a new CPAP machine for him.  I will set the machine at 8 cm water pressure without EPR, heated humidification will be provided in the mask of the patient's choice.  The patient is currently using a full facemask.  Follow-up within the next 90 days after therapy begin.  I have ordered an urgent delivery for his machine.  INTERPRETING PHYSICIAN: Melvyn Novas, MD   Medical Director of [facility] at [facility].

## 2020-11-29 NOTE — Telephone Encounter (Signed)
I called pt. I advised pt that Dr. Vickey Huger reviewed their sleep study results and found that pt has severe sleep apnea. Dr. Vickey Huger recommends that pt continue CPAP. I reviewed PAP compliance expectations with the pt. Pt is agreeable to starting a CPAP. I advised pt that an order will be sent to a DME, [facility], and [facility] will call the pt within about one week after they file with the pt's insurance. [facility] will show the pt how to use the machine, fit for masks, and troubleshoot the CPAP if needed. A follow up appt was made for insurance purposes with Dr. Vickey Huger on 03/26/21 at 9:30 am. Pt verbalized understanding to arrive 15 minutes early and bring their CPAP. A letter with all of this information in it will be mailed to the pt as a reminder. I verified with the pt that the address we have on file is correct. Pt verbalized understanding of results. Pt had no questions at this time but was encouraged to call back if questions arise. I have sent the order to [facility] and have received confirmation that they have received the order.

## 2020-11-29 NOTE — Telephone Encounter (Signed)
Pt called  See other note 

## 2021-01-02 ENCOUNTER — Telehealth: Payer: Self-pay

## 2021-01-02 NOTE — Telephone Encounter (Signed)
Patient requesting a call back from nurse regarding CPAP prescription and mask. Please call back at [phone].

## 2021-01-02 NOTE — Telephone Encounter (Signed)
Called the patient back. There was no answer. LVM advising that I sent the orders to [facility] and provided the phone number to him. I will resend the orders today in case they didn't go through.

## 2021-01-22 DIAGNOSIS — G4733 Obstructive sleep apnea (adult) (pediatric): Secondary | ICD-10-CM | POA: Diagnosis not present

## 2021-02-21 DIAGNOSIS — G4733 Obstructive sleep apnea (adult) (pediatric): Secondary | ICD-10-CM | POA: Diagnosis not present

## 2021-03-12 NOTE — Telephone Encounter (Signed)
Pt was called , DPR was checked.  A vm was left on cell# asking pt to call to discuss the need to cx the January appointment until he has received his new machine.  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

## 2021-03-12 NOTE — Telephone Encounter (Signed)
Checked on the status with [facility]   "Patient has a balance in collection of [phone] and a current balance of $232.63. We will not be able to process this order until the balance is either taken care of or he calls the billing department and sets up a payment arrangement. I will call him and let him know of this."  Pt is scheduled for 1/10 for initial cpap follow up and has not started the new machine. I will have our staff contact the patient to cancel this apt and advise the pt to call when he is scheduled with the new machine.

## 2021-03-13 NOTE — Telephone Encounter (Signed)
Called the pt and there was no answer. LVM again asking for a call back.  **If the pt calls back please ask since he has not started the new CPAP machine does he want to still keep the Jan 10 apt. The purpose of the visit was for follow up from initial CPAP and since he has not started new machine the visit from our standpoint is technically not necessary.

## 2021-03-20 NOTE — Telephone Encounter (Signed)
LVM asking pt to call back regarding appt 03/26/21

## 2021-03-21 NOTE — Telephone Encounter (Signed)
LVM asking pt to call the office in regards to appt 03/26/21.

## 2021-03-21 NOTE — Telephone Encounter (Signed)
Spoke with pt, have not received CPAP machine. Pt contacted DME, they said maybe in a couple of weeks.  Informed pt will need to contact [facility] the day you receive your CPAP to reschedule your Initial CPAP appt 31-90 days. Pt verbalized understand.

## 2021-03-24 DIAGNOSIS — G4733 Obstructive sleep apnea (adult) (pediatric): Secondary | ICD-10-CM | POA: Diagnosis not present

## 2021-03-26 ENCOUNTER — Ambulatory Visit: Payer: BC Managed Care – PPO | Admitting: Neurology

## 2021-04-22 DIAGNOSIS — G4733 Obstructive sleep apnea (adult) (pediatric): Secondary | ICD-10-CM | POA: Diagnosis not present

## 2021-05-20 DIAGNOSIS — G4733 Obstructive sleep apnea (adult) (pediatric): Secondary | ICD-10-CM | POA: Diagnosis not present

## 2021-06-03 DIAGNOSIS — L718 Other rosacea: Secondary | ICD-10-CM | POA: Diagnosis not present

## 2021-06-03 DIAGNOSIS — L570 Actinic keratosis: Secondary | ICD-10-CM | POA: Diagnosis not present

## 2021-06-03 DIAGNOSIS — L578 Other skin changes due to chronic exposure to nonionizing radiation: Secondary | ICD-10-CM | POA: Diagnosis not present

## 2021-06-03 DIAGNOSIS — L814 Other melanin hyperpigmentation: Secondary | ICD-10-CM | POA: Diagnosis not present

## 2021-06-04 ENCOUNTER — Ambulatory Visit: Payer: BC Managed Care – PPO | Admitting: Neurology

## 2021-06-11 DIAGNOSIS — E785 Hyperlipidemia, unspecified: Secondary | ICD-10-CM | POA: Diagnosis not present

## 2021-06-11 DIAGNOSIS — Z125 Encounter for screening for malignant neoplasm of prostate: Secondary | ICD-10-CM | POA: Diagnosis not present

## 2021-06-18 DIAGNOSIS — R82998 Other abnormal findings in urine: Secondary | ICD-10-CM | POA: Diagnosis not present

## 2021-06-18 DIAGNOSIS — Z0000 Encounter for general adult medical examination without abnormal findings: Secondary | ICD-10-CM | POA: Diagnosis not present

## 2021-06-18 DIAGNOSIS — Z1339 Encounter for screening examination for other mental health and behavioral disorders: Secondary | ICD-10-CM | POA: Diagnosis not present

## 2021-06-18 DIAGNOSIS — Z1331 Encounter for screening for depression: Secondary | ICD-10-CM | POA: Diagnosis not present

## 2021-06-18 DIAGNOSIS — R030 Elevated blood-pressure reading, without diagnosis of hypertension: Secondary | ICD-10-CM | POA: Diagnosis not present

## 2021-06-18 DIAGNOSIS — R0789 Other chest pain: Secondary | ICD-10-CM | POA: Diagnosis not present

## 2021-06-19 ENCOUNTER — Other Ambulatory Visit: Payer: Self-pay | Admitting: Internal Medicine

## 2021-06-19 DIAGNOSIS — E785 Hyperlipidemia, unspecified: Secondary | ICD-10-CM

## 2021-06-20 DIAGNOSIS — G4733 Obstructive sleep apnea (adult) (pediatric): Secondary | ICD-10-CM | POA: Diagnosis not present

## 2021-06-25 ENCOUNTER — Telehealth: Payer: Self-pay | Admitting: Neurology

## 2021-06-25 NOTE — Telephone Encounter (Signed)
DME: [facility] ?Phone: [phone], press option 1 ?Fax: [phone] ?  ?Set up on 06/20/21 w an AirSense 11 ?F/u 07/22/21-09/18/21 ?Phone rep called pt to schedule initial CPAP f/u. DPR did not give consent to leave a vm on telephone# listed ?

## 2021-07-16 DIAGNOSIS — J301 Allergic rhinitis due to pollen: Secondary | ICD-10-CM | POA: Diagnosis not present

## 2021-07-20 DIAGNOSIS — G4733 Obstructive sleep apnea (adult) (pediatric): Secondary | ICD-10-CM | POA: Diagnosis not present

## 2021-07-30 ENCOUNTER — Ambulatory Visit
Admission: RE | Admit: 2021-07-30 | Discharge: 2021-07-30 | Disposition: A | Discharge status: Home / Self Care | Payer: Self-pay | Source: Ambulatory Visit | Attending: Internal Medicine | Admitting: Internal Medicine

## 2021-07-30 DIAGNOSIS — E785 Hyperlipidemia, unspecified: Secondary | ICD-10-CM

## 2021-08-20 DIAGNOSIS — G4733 Obstructive sleep apnea (adult) (pediatric): Secondary | ICD-10-CM | POA: Diagnosis not present

## 2021-09-19 DIAGNOSIS — G4733 Obstructive sleep apnea (adult) (pediatric): Secondary | ICD-10-CM | POA: Diagnosis not present

## 2021-10-20 DIAGNOSIS — G4733 Obstructive sleep apnea (adult) (pediatric): Secondary | ICD-10-CM | POA: Diagnosis not present

## 2021-10-21 DIAGNOSIS — G4733 Obstructive sleep apnea (adult) (pediatric): Secondary | ICD-10-CM | POA: Diagnosis not present

## 2021-11-20 DIAGNOSIS — G4733 Obstructive sleep apnea (adult) (pediatric): Secondary | ICD-10-CM | POA: Diagnosis not present

## 2021-11-21 DIAGNOSIS — G4733 Obstructive sleep apnea (adult) (pediatric): Secondary | ICD-10-CM | POA: Diagnosis not present

## 2021-12-20 DIAGNOSIS — G4733 Obstructive sleep apnea (adult) (pediatric): Secondary | ICD-10-CM | POA: Diagnosis not present

## 2021-12-21 DIAGNOSIS — G4733 Obstructive sleep apnea (adult) (pediatric): Secondary | ICD-10-CM | POA: Diagnosis not present

## 2022-01-20 DIAGNOSIS — G4733 Obstructive sleep apnea (adult) (pediatric): Secondary | ICD-10-CM | POA: Diagnosis not present

## 2022-02-19 DIAGNOSIS — G4733 Obstructive sleep apnea (adult) (pediatric): Secondary | ICD-10-CM | POA: Diagnosis not present

## 2022-03-22 DIAGNOSIS — G4733 Obstructive sleep apnea (adult) (pediatric): Secondary | ICD-10-CM | POA: Diagnosis not present

## 2022-04-22 DIAGNOSIS — G4733 Obstructive sleep apnea (adult) (pediatric): Secondary | ICD-10-CM | POA: Diagnosis not present

## 2022-06-09 DIAGNOSIS — D1801 Hemangioma of skin and subcutaneous tissue: Secondary | ICD-10-CM | POA: Diagnosis not present

## 2022-06-09 DIAGNOSIS — W908XXS Exposure to other nonionizing radiation, sequela: Secondary | ICD-10-CM | POA: Diagnosis not present

## 2022-06-09 DIAGNOSIS — L570 Actinic keratosis: Secondary | ICD-10-CM | POA: Diagnosis not present

## 2022-06-09 DIAGNOSIS — L814 Other melanin hyperpigmentation: Secondary | ICD-10-CM | POA: Diagnosis not present

## 2022-06-09 DIAGNOSIS — L578 Other skin changes due to chronic exposure to nonionizing radiation: Secondary | ICD-10-CM | POA: Diagnosis not present

## 2022-07-01 DIAGNOSIS — R7989 Other specified abnormal findings of blood chemistry: Secondary | ICD-10-CM | POA: Diagnosis not present

## 2022-07-01 DIAGNOSIS — E785 Hyperlipidemia, unspecified: Secondary | ICD-10-CM | POA: Diagnosis not present

## 2022-07-01 DIAGNOSIS — R030 Elevated blood-pressure reading, without diagnosis of hypertension: Secondary | ICD-10-CM | POA: Diagnosis not present

## 2022-07-01 DIAGNOSIS — R7301 Impaired fasting glucose: Secondary | ICD-10-CM | POA: Diagnosis not present

## 2022-07-01 DIAGNOSIS — Z125 Encounter for screening for malignant neoplasm of prostate: Secondary | ICD-10-CM | POA: Diagnosis not present

## 2022-07-10 DIAGNOSIS — Z0000 Encounter for general adult medical examination without abnormal findings: Secondary | ICD-10-CM | POA: Diagnosis not present

## 2022-07-10 DIAGNOSIS — R030 Elevated blood-pressure reading, without diagnosis of hypertension: Secondary | ICD-10-CM | POA: Diagnosis not present

## 2022-07-10 DIAGNOSIS — Z1339 Encounter for screening examination for other mental health and behavioral disorders: Secondary | ICD-10-CM | POA: Diagnosis not present

## 2022-07-10 DIAGNOSIS — E785 Hyperlipidemia, unspecified: Secondary | ICD-10-CM | POA: Diagnosis not present

## 2022-07-10 DIAGNOSIS — R7301 Impaired fasting glucose: Secondary | ICD-10-CM | POA: Diagnosis not present

## 2022-07-10 DIAGNOSIS — R7989 Other specified abnormal findings of blood chemistry: Secondary | ICD-10-CM | POA: Diagnosis not present

## 2022-07-10 DIAGNOSIS — E8881 Metabolic syndrome: Secondary | ICD-10-CM | POA: Diagnosis not present

## 2022-07-10 DIAGNOSIS — Z1331 Encounter for screening for depression: Secondary | ICD-10-CM | POA: Diagnosis not present

## 2022-08-10 ENCOUNTER — Ambulatory Visit (INDEPENDENT_AMBULATORY_CARE_PROVIDER_SITE_OTHER): Payer: BC Managed Care – PPO

## 2022-08-10 ENCOUNTER — Ambulatory Visit
Admission: EM | Admit: 2022-08-10 | Discharge: 2022-08-10 | Disposition: A | Discharge status: Home / Self Care | Payer: BC Managed Care – PPO | Attending: Internal Medicine | Admitting: Internal Medicine

## 2022-08-10 ENCOUNTER — Encounter: Payer: Self-pay | Admitting: Emergency Medicine

## 2022-08-10 DIAGNOSIS — K5900 Constipation, unspecified: Secondary | ICD-10-CM

## 2022-08-10 DIAGNOSIS — R1030 Lower abdominal pain, unspecified: Secondary | ICD-10-CM

## 2022-08-10 DIAGNOSIS — R140 Abdominal distension (gaseous): Secondary | ICD-10-CM

## 2022-08-10 NOTE — Discharge Instructions (Signed)
Please continue taking stool softener I will advise you to take ibuprofen as needed for pain Return to urgent care if symptoms persist X-ray of your abdomen is negative for bowel obstruction or constipation.

## 2022-08-10 NOTE — ED Provider Notes (Signed)
[facility]    CSN: [id number] Arrival date & time: 08/10/22  1123      History   Chief Complaint Chief Complaint  Patient presents with   Fever    HPI Bobby Murphy is a 47 y.o. male comes to the urgent care with a 2-day history of lower abdominal bloating with pain.  Patient's symptoms started 2 days ago and has been persistent.  Patient endorses decreased in the frequency and volume of bowel movements.  Patient did not have any bowel movement since Friday.  He endorses abdominal bloating.  No fever or chills.  No abdominal distention.  No heavy lifting or trauma to the abdomen.  Abdominal pain is aggravated laying down from his sitting position and sitting up from position when he is laying down.  No dysuria urgency or frequency.  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 Authorizing Provider  escitalopram (LEXAPRO) 10 MG tablet Take 1 tablet by mouth daily. 06/16/20   [provider]  Omega-3 Fatty Acids (FISH OIL) 1000 MG CAPS Take by mouth.    [provider]    Family History Family History  Problem Relation Age of Onset   Hypertension Mother    Hyperlipidemia Mother    Sleep apnea Mother     Kidney failure Father    COPD Father    Pancreatitis Brother    Alcoholism Brother    Cancer Maternal Grandfather    COPD Paternal Grandmother    Heart failure Paternal Grandfather     Social History Social History   Tobacco Use   Smoking status: Former    Packs/day: 1.00    Years: 10.00    Additional pack years: 0.00    Total pack years: 10.00    Types: Cigarettes    Quit date: 2005    Years since quitting: 19.4   Smokeless tobacco: Never  Vaping Use   Vaping Use: Never used  Substance Use Topics   Alcohol use: Yes    Alcohol/week: 0.0 - 2.0 standard drinks of alcohol    Comment: socially   Drug use: Not Currently    Types: Marijuana     Allergies   Patient has no known allergies.   Review of Systems Review of Systems As per HPI  Physical Exam Triage Vital Signs ED Triage Vitals  Enc Vitals Group     BP 08/10/22 1143 125/84     Pulse Rate 08/10/22 1143 100     Resp 08/10/22 1143 16     Temp 08/10/22 1143 98.9 F (37.2 C)     Temp Source 08/10/22 1143  Oral     SpO2 08/10/22 1143 98 %     Weight 08/10/22 1142 235 lb (106.6 kg)     Height 08/10/22 1142 5\' 10"  (1.778 m)     Head Circumference --      Peak Flow --      Pain Score 08/10/22 1141 3     Pain Loc --      Pain Edu? --      Excl. in GC? --    No data found.  Updated Vital Signs BP 125/84 (BP Location: Left Arm)   Pulse 100   Temp 98.9 F (37.2 C) (Oral)   Resp 16   Ht 5\' 10"  (1.778 m)   Wt 106.6 kg   SpO2 98%   BMI 33.72 kg/m   Visual Acuity Right Eye Distance:   Left Eye Distance:   Bilateral Distance:    Right Eye Near:   Left Eye Near:    Bilateral Near:     Physical Exam Vitals and nursing note reviewed.  Constitutional:      General: He is not in acute distress.    Appearance: He is not ill-appearing.  Cardiovascular:     Rate and Rhythm: Normal rate and regular rhythm.  Pulmonary:     Effort: Pulmonary effort is normal.     Breath sounds: Normal breath sounds.   Abdominal:     General: There is distension.     Comments: Bowel sounds are mildly hyperactive.  Neurological:     Mental Status: He is alert.      UC Treatments / Results  Labs (all labs ordered are listed, but only abnormal results are displayed) Labs Reviewed - No data to display  EKG   Radiology DG Abd 1 View  Result Date: 08/10/2022 CLINICAL DATA:  Abdominal bloating and constipation. EXAM: ABDOMEN - 1 VIEW COMPARISON:  None Available. FINDINGS: The bowel gas pattern is normal. No radio-opaque calculi or other significant radiographic abnormality are seen. IMPRESSION: Negative. Electronically Signed   By: Obie Dredge M.D.   On: 08/10/2022 12:20    Procedures Procedures (including critical care time)  Medications Ordered in UC Medications - No data to display  Initial Impression / Assessment and Plan / UC Course  I have reviewed the triage vital signs and the nursing notes.  Pertinent labs & imaging results that were available during my care of the patient were reviewed by me and considered in my medical decision making (see chart for details).     1.  Lower abdominal pain: KUB is negative for bowel obstruction or constipation Patient is advised to continue taking laxatives Maintain adequate hydration Return precautions given. Final Clinical Impressions(s) / UC Diagnoses   Final diagnoses:  Lower abdominal pain     Discharge Instructions      Please continue taking stool softener I will advise you to take ibuprofen as needed for pain Return to urgent care if symptoms persist X-ray of your abdomen is negative for bowel obstruction or constipation.   ED Prescriptions   None    PDMP not reviewed this encounter.   Merrilee Jansky, MD 08/10/22 1250

## 2022-08-10 NOTE — ED Triage Notes (Signed)
Tmax 100 x 24 hours  Chills & sweats  Pt had a COVID exposure w/in the last 2 weeks  Body aches  Denies congestion  Pt also has lower abdominal pain  Pt took 2 laxatives yesterday - min out put Bloated feeeling

## 2023-05-28 DIAGNOSIS — K5900 Constipation, unspecified: Secondary | ICD-10-CM | POA: Diagnosis not present

## 2023-05-28 DIAGNOSIS — R109 Unspecified abdominal pain: Secondary | ICD-10-CM | POA: Diagnosis not present

## 2023-06-08 DIAGNOSIS — D1801 Hemangioma of skin and subcutaneous tissue: Secondary | ICD-10-CM | POA: Diagnosis not present

## 2023-06-08 DIAGNOSIS — L814 Other melanin hyperpigmentation: Secondary | ICD-10-CM | POA: Diagnosis not present

## 2023-06-08 DIAGNOSIS — L400 Psoriasis vulgaris: Secondary | ICD-10-CM | POA: Diagnosis not present

## 2023-06-08 DIAGNOSIS — L570 Actinic keratosis: Secondary | ICD-10-CM | POA: Diagnosis not present

## 2023-06-08 DIAGNOSIS — L718 Other rosacea: Secondary | ICD-10-CM | POA: Diagnosis not present

## 2023-07-20 DIAGNOSIS — R7301 Impaired fasting glucose: Secondary | ICD-10-CM | POA: Diagnosis not present

## 2023-07-20 DIAGNOSIS — E785 Hyperlipidemia, unspecified: Secondary | ICD-10-CM | POA: Diagnosis not present

## 2023-07-20 DIAGNOSIS — Z125 Encounter for screening for malignant neoplasm of prostate: Secondary | ICD-10-CM | POA: Diagnosis not present

## 2023-07-27 ENCOUNTER — Other Ambulatory Visit: Payer: Self-pay | Admitting: Internal Medicine

## 2023-07-27 DIAGNOSIS — R82998 Other abnormal findings in urine: Secondary | ICD-10-CM | POA: Diagnosis not present

## 2023-07-27 DIAGNOSIS — R911 Solitary pulmonary nodule: Secondary | ICD-10-CM

## 2023-07-27 DIAGNOSIS — Z0000 Encounter for general adult medical examination without abnormal findings: Secondary | ICD-10-CM | POA: Diagnosis not present

## 2023-07-27 DIAGNOSIS — E8881 Metabolic syndrome: Secondary | ICD-10-CM | POA: Diagnosis not present

## 2023-07-27 DIAGNOSIS — Z1331 Encounter for screening for depression: Secondary | ICD-10-CM | POA: Diagnosis not present

## 2023-07-27 DIAGNOSIS — Z1339 Encounter for screening examination for other mental health and behavioral disorders: Secondary | ICD-10-CM | POA: Diagnosis not present

## 2023-08-01 IMAGING — CT CT CARDIAC CORONARY ARTERY CALCIUM SCORE
3 series · 14 of 20 positions shown, 16 images · non-contrast
Comparison: None Available.

CLINICAL DATA: 46-year-old white male with hyperlipidemia.
Hyperlipidemia, unspecified hyperlipidemia type.

EXAM:
CT CARDIAC CORONARY ARTERY CALCIUM SCORE
TECHNIQUE: Non-contrast imaging through the heart was performed using
prospective ECG gating. Image post processing was performed on an
independent workstation, allowing for quantitative analysis of the
heart and coronary arteries. Note that this exam targets the heart
and the chest was not imaged in its entirety.

[Series 2: calcium scoring 2.00 qr36 bestdiast 66% hrt calciu · axial · 0.50mm/px · z∈[+1612,+1708]mm · 4 of 80 slices shown]
[im 16/80  vessel]
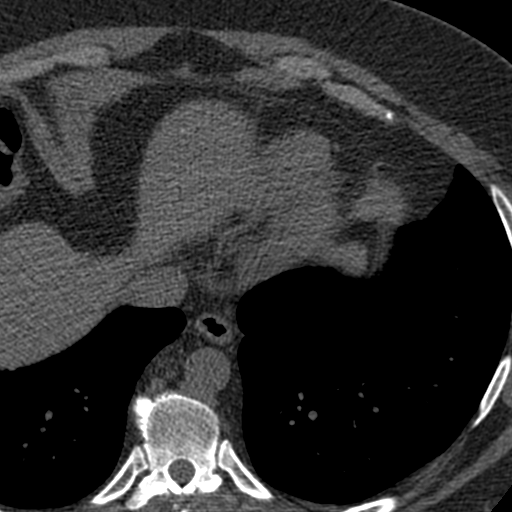
[im 32/80  vessel]
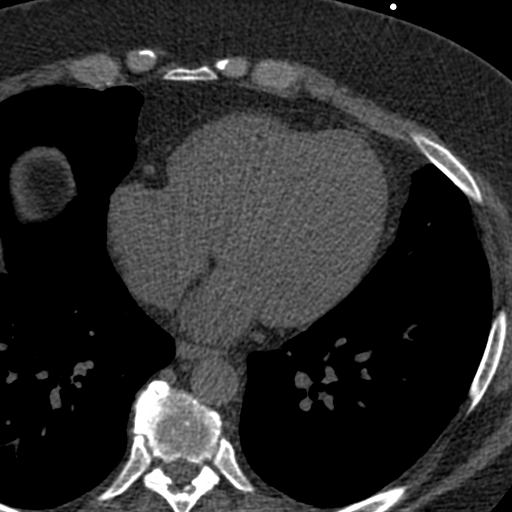
[im 48/80  vessel]
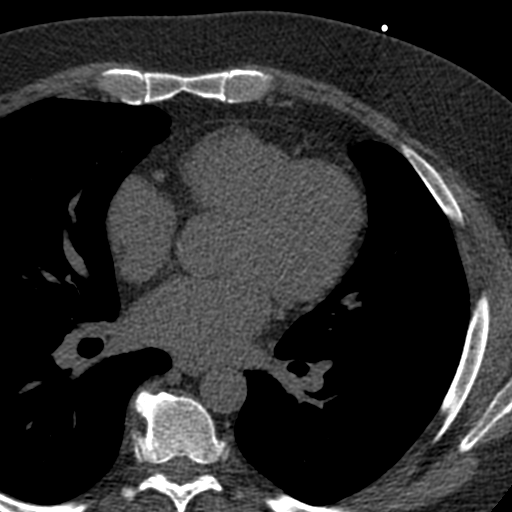
[im 64/80  vessel]
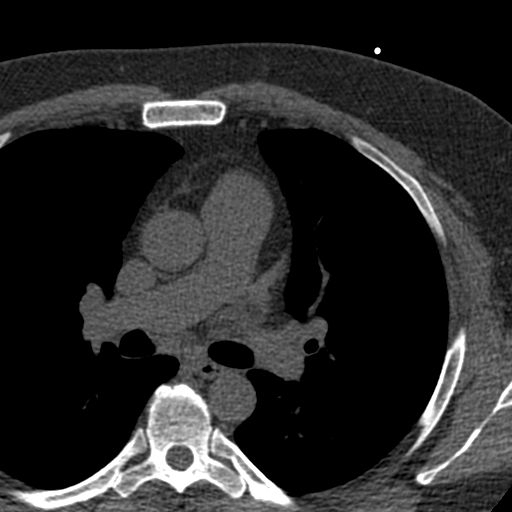

[Series 3: calcium scoring 2.00 br40 bestdiast 66% axial · axial · 0.67mm/px · z∈[+1608,+1712]mm · 5 of 80 slices shown, 7 images]
[im 14/80  vessel]
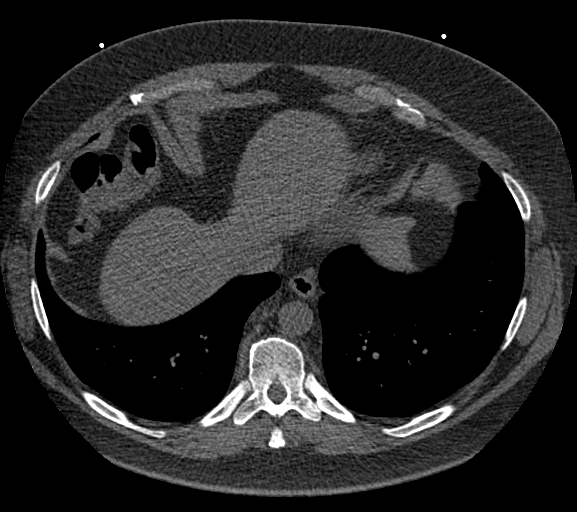
[im 14/80  lung]
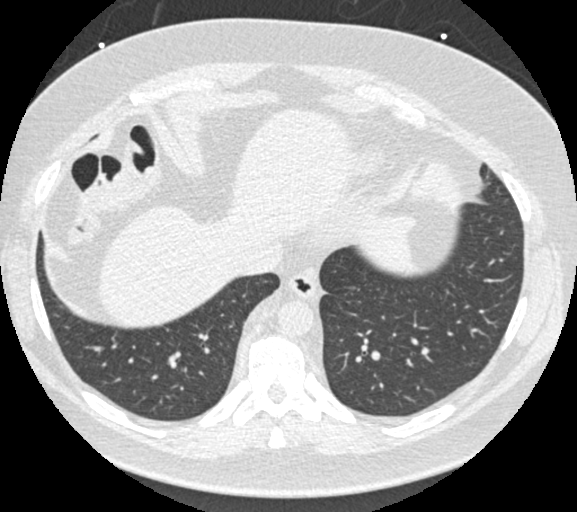
[im 27/80  vessel]
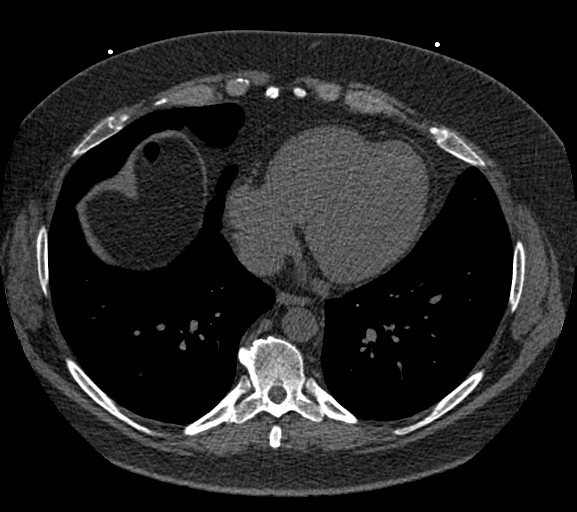
[im 40/80  vessel]
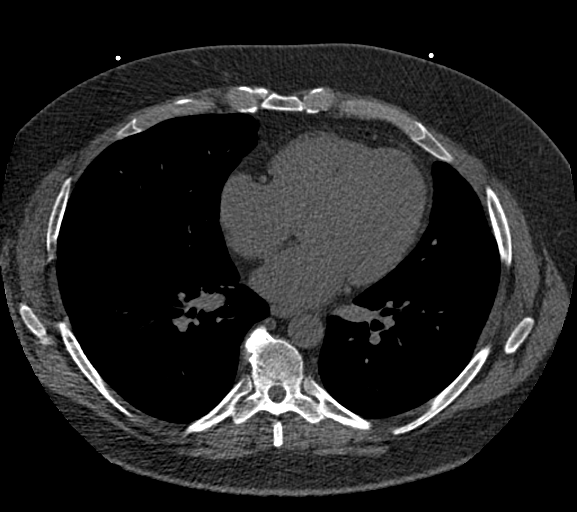
[im 53/80  vessel]
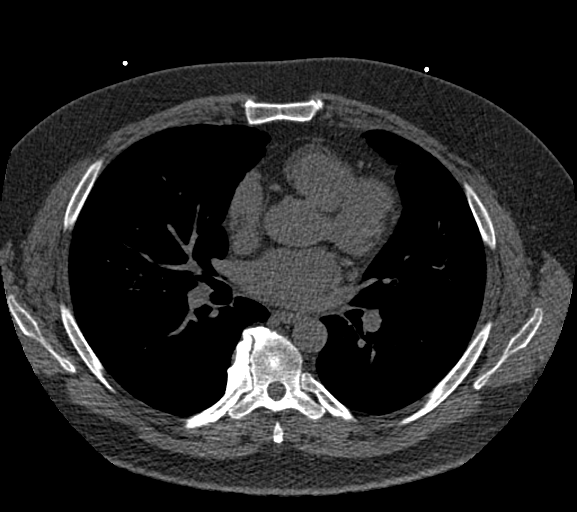
[im 66/80  vessel]
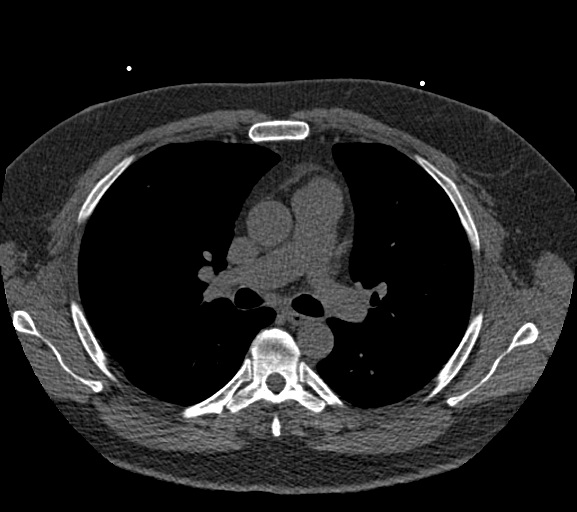
[im 66/80  lung]
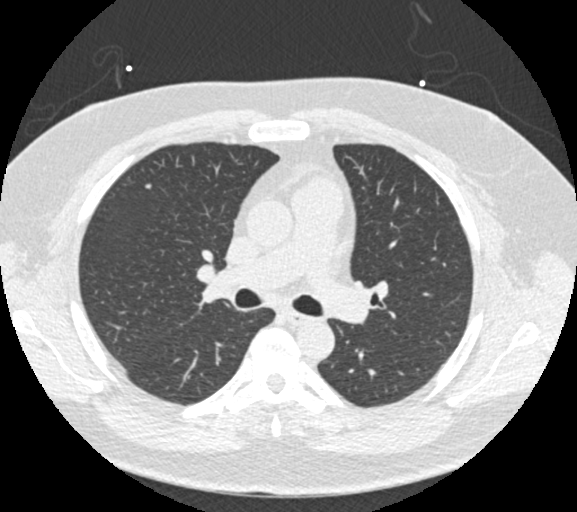

[Series 9: calcium scoring 2.00 br60 bestdiast 66% lungs · axial · 0.66mm/px · z∈[+1608,+1712]mm · 5 of 80 slices shown]
[im 14/80  vessel]
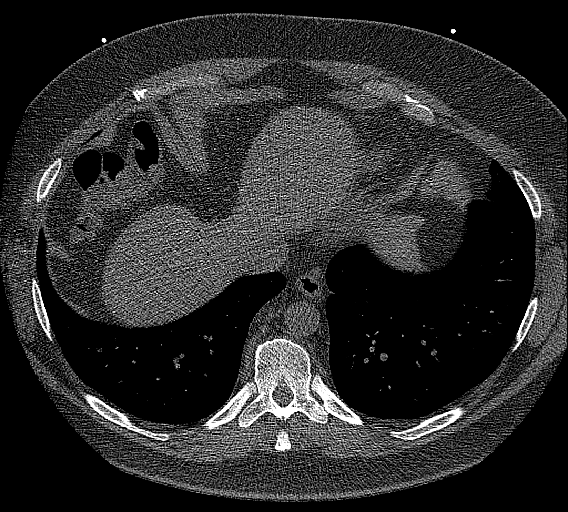
[im 27/80  vessel]
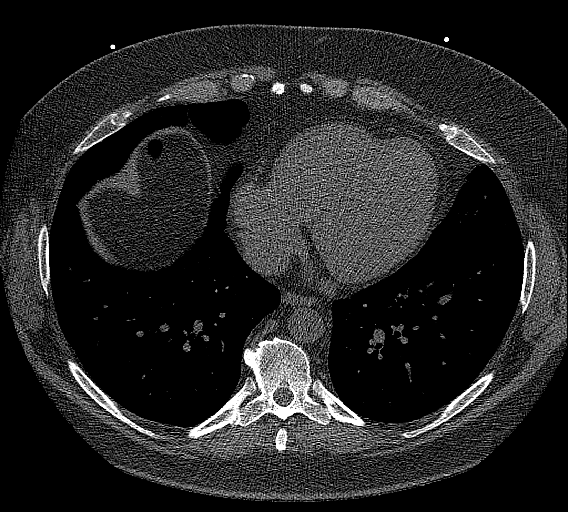
[im 40/80  vessel]
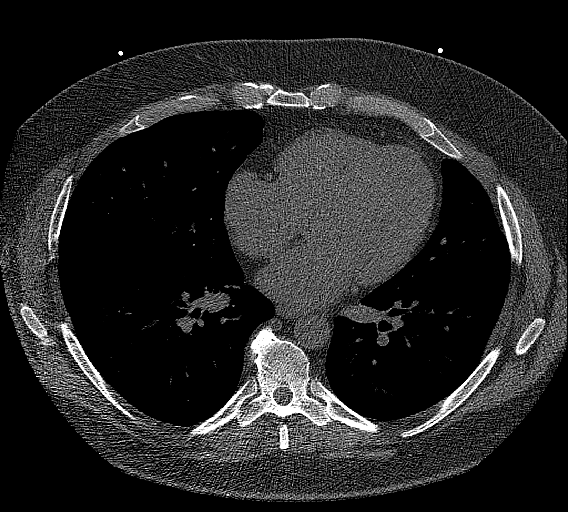
[im 53/80  vessel]
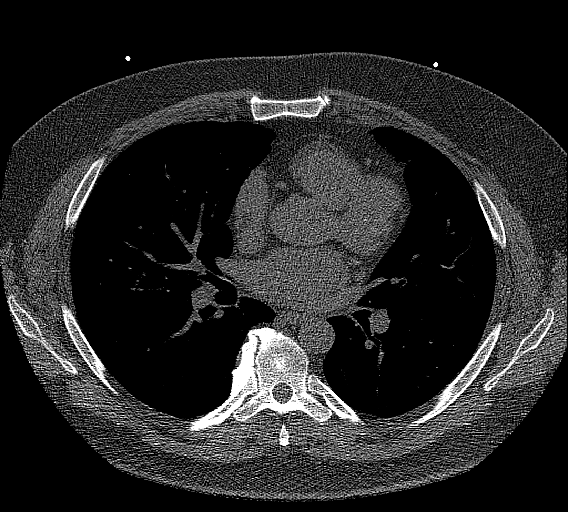
[im 66/80  vessel]
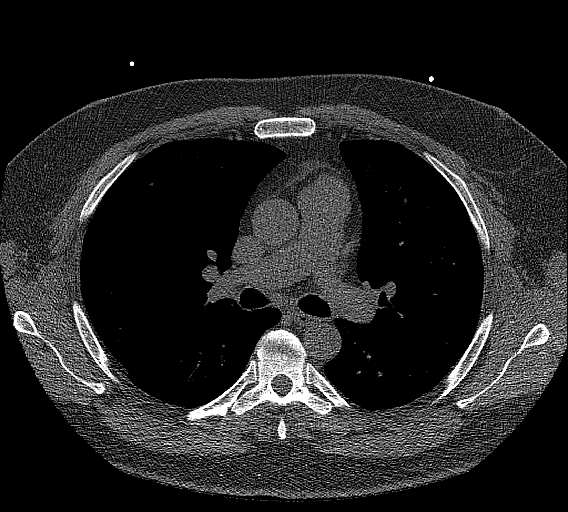

[14 of 20 positions shown; findings below may reference images not displayed]

FINDINGS: CORONARY CALCIUM SCORES:

Left Main: 0

LAD:

LCx: 0

RCA: 0

Total Agatston Score:

[HOSPITAL] percentile: 87

AORTA MEASUREMENTS:

Ascending Aorta: 30 mm

Descending Aorta: 26 mm

OTHER FINDINGS:

Multiple small mediastinal lymph nodes that are not pathologically
enlarged. Heart size is normal. Focal hypodensity in the left
hepatic lobe on sequence 3 image 74 has water attenuation and
probably represents a small cyst but too small to definitively
characterize. There are 2 small nodules in the visualized right
upper lobe, largest measures 3 mm near the right minor fissure
sequence 9 image 15. Tiny pleural-based nodular densities in the
right middle lobe. 5 mm nodule in the left lower lobe adjacent to
the left major fissure on sequence 9 image 29. No large areas of
airspace disease or consolidation in the visualized lungs. Bridging
osteophytes in the thoracic spine. No acute bone abnormality.
IMPRESSION: 1. Coronary calcium score is 21.2 and this is at percentile 87 for
patients of the same age, gender and ethnicity.
2. Multiple small pulmonary nodules in the visualized lungs. Largest
pulmonary nodule measures 5 mm. No follow-up needed if patient is
low-risk (and has no known or suspected primary neoplasm).
Non-contrast chest CT can be considered in 12 months if patient is
high-risk. This recommendation follows the consensus statement:
Guidelines for Management of Incidental Pulmonary Nodules Detected

## 2023-10-01 ENCOUNTER — Encounter: Payer: Self-pay | Admitting: Internal Medicine
